# Patient Record
Sex: Male | Born: 1990 | Race: Black or African American | Hispanic: No | Marital: Single | Smoking: Never smoker
Health system: Southern US, Community
[De-identification: ages and names within clinical notes are randomized; demographics above are authoritative.]

---
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
## 2011-08-07 ENCOUNTER — Emergency Department (HOSPITAL_COMMUNITY)
Admission: EM | Admit: 2011-08-07 | Discharge: 2011-08-07 | Disposition: A | Discharge status: Home / Self Care | Payer: Self-pay | Attending: Emergency Medicine | Admitting: Emergency Medicine

## 2011-08-07 DIAGNOSIS — M719 Bursopathy, unspecified: Secondary | ICD-10-CM | POA: Insufficient documentation

## 2011-08-07 DIAGNOSIS — M25519 Pain in unspecified shoulder: Secondary | ICD-10-CM | POA: Insufficient documentation

## 2011-08-07 DIAGNOSIS — M67919 Unspecified disorder of synovium and tendon, unspecified shoulder: Secondary | ICD-10-CM | POA: Insufficient documentation

## 2012-04-23 ENCOUNTER — Emergency Department (HOSPITAL_COMMUNITY): Payer: BC Managed Care – PPO

## 2012-04-23 ENCOUNTER — Emergency Department (HOSPITAL_COMMUNITY)
Admission: EM | Admit: 2012-04-23 | Discharge: 2012-04-23 | Disposition: A | Discharge status: Home / Self Care | Payer: BC Managed Care – PPO | Attending: Emergency Medicine | Admitting: Emergency Medicine

## 2012-04-23 ENCOUNTER — Encounter (HOSPITAL_COMMUNITY): Payer: Self-pay | Admitting: *Deleted

## 2012-04-23 DIAGNOSIS — K2990 Gastroduodenitis, unspecified, without bleeding: Secondary | ICD-10-CM | POA: Insufficient documentation

## 2012-04-23 DIAGNOSIS — M545 Low back pain, unspecified: Secondary | ICD-10-CM | POA: Insufficient documentation

## 2012-04-23 DIAGNOSIS — M549 Dorsalgia, unspecified: Secondary | ICD-10-CM

## 2012-04-23 DIAGNOSIS — K2970 Gastritis, unspecified, without bleeding: Secondary | ICD-10-CM

## 2012-04-23 LAB — URINALYSIS, ROUTINE W REFLEX MICROSCOPIC
Glucose, UA: NEGATIVE mg/dL
Hgb urine dipstick: NEGATIVE
Ketones, ur: 15 mg/dL — AB
Leukocytes, UA: NEGATIVE
Nitrite: NEGATIVE
Protein, ur: NEGATIVE mg/dL
Specific Gravity, Urine: 1.036 — ABNORMAL HIGH (ref 1.005–1.030)
Urobilinogen, UA: 1 mg/dL (ref 0.0–1.0)
pH: 6 (ref 5.0–8.0)

## 2012-04-23 LAB — CBC WITH DIFFERENTIAL/PLATELET
Basophils Absolute: 0 10*3/uL (ref 0.0–0.1)
Basophils Relative: 0 % (ref 0–1)
Eosinophils Absolute: 0 10*3/uL (ref 0.0–0.7)
Eosinophils Relative: 0 % (ref 0–5)
HCT: 46.4 % (ref 39.0–52.0)
Hemoglobin: 15.8 g/dL (ref 13.0–17.0)
Lymphocytes Relative: 6 % — ABNORMAL LOW (ref 12–46)
Lymphs Abs: 0.5 10*3/uL — ABNORMAL LOW (ref 0.7–4.0)
MCH: 30.8 pg (ref 26.0–34.0)
MCHC: 34.1 g/dL (ref 30.0–36.0)
MCV: 90.4 fL (ref 78.0–100.0)
Monocytes Absolute: 0.4 10*3/uL (ref 0.1–1.0)
Monocytes Relative: 5 % (ref 3–12)
Neutro Abs: 7.6 10*3/uL (ref 1.7–7.7)
Neutrophils Relative %: 89 % — ABNORMAL HIGH (ref 43–77)
Platelets: 210 10*3/uL (ref 150–400)
RBC: 5.13 MIL/uL (ref 4.22–5.81)
RDW: 12.1 % (ref 11.5–15.5)
WBC: 8.6 10*3/uL (ref 4.0–10.5)

## 2012-04-23 LAB — COMPREHENSIVE METABOLIC PANEL WITH GFR
ALT: 21 U/L (ref 0–53)
AST: 25 U/L (ref 0–37)
Albumin: 4.5 g/dL (ref 3.5–5.2)
Alkaline Phosphatase: 86 U/L (ref 39–117)
BUN: 12 mg/dL (ref 6–23)
CO2: 26 meq/L (ref 19–32)
Calcium: 9.6 mg/dL (ref 8.4–10.5)
Chloride: 99 meq/L (ref 96–112)
Creatinine, Ser: 0.97 mg/dL (ref 0.50–1.35)
GFR calc Af Amer: >90 mL/min
GFR calc non Af Amer: >90 mL/min
Glucose, Bld: 91 mg/dL (ref 70–99)
Potassium: 4.3 meq/L (ref 3.5–5.1)
Sodium: 134 meq/L — ABNORMAL LOW (ref 135–145)
Total Bilirubin: 1.9 mg/dL — ABNORMAL HIGH (ref 0.3–1.2)
Total Protein: 7.9 g/dL (ref 6.0–8.3)

## 2012-04-23 MED ORDER — KETOROLAC TROMETHAMINE 60 MG/2ML IM SOLN
60.0000 mg | Freq: Once | INTRAMUSCULAR | Status: AC
  Administered 2012-04-23: 60 mg via INTRAMUSCULAR
  Filled 2012-04-23: qty 2

## 2012-04-23 MED ORDER — DICLOFENAC SODIUM 75 MG PO TBEC: 75.0000 mg | DELAYED_RELEASE_TABLET | Freq: Two times a day (BID) | ORAL | Status: AC

## 2012-04-23 MED ORDER — ONDANSETRON 8 MG PO TBDP
8.0000 mg | ORAL_TABLET | Freq: Once | ORAL | Status: AC
Start: 2012-04-23 — End: 2012-04-23
  Administered 2012-04-23: 8 mg via ORAL
  Filled 2012-04-23: qty 1

## 2012-04-23 MED ORDER — PROMETHAZINE HCL 25 MG PO TABS: 25.0000 mg | ORAL_TABLET | Freq: Four times a day (QID) | ORAL | Status: AC | PRN

## 2012-04-23 MED ORDER — CYCLOBENZAPRINE HCL 10 MG PO TABS: 10.0000 mg | ORAL_TABLET | Freq: Three times a day (TID) | ORAL | Status: AC | PRN

## 2012-04-23 MED ORDER — GI COCKTAIL ~~LOC~~
30.0000 mL | Freq: Once | ORAL | Status: AC
  Administered 2012-04-23: 30 mL via ORAL
  Filled 2012-04-23: qty 30

## 2012-04-23 NOTE — ED Notes (Signed)
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 Pt denies any nausea at this time but states he has had several emesis today

## 2012-04-23 NOTE — ED Notes (Signed)
Pt states he has had back pain since he was younger. Pt states pain has increased. Pt denies any injury to his back. Pt states pain radiates to hip with nausea and emesis

## 2012-04-23 NOTE — ED Provider Notes (Signed)
History     CSN: [id number]  Arrival date & time 04/23/12  1646   First MD Initiated Contact with Patient 04/23/12 1734      6:46 PM HPI Reports chronic back pain since middle school.  Reports back is hurting again. Reports pain is sharp and radiates to bilateral hips. Denies saddle anesthesias, perineal numbness, abdominal pain, fever, or urinary symptoms. Pt does reports nausea and vomiting, which is a new symptoms for him Patient is a 21 y.o. male presenting with back pain and vomiting. The history is provided by the patient and a parent.  Back Pain  This is a new problem. The problem occurs constantly. The problem has not changed since onset.The pain is associated with no known injury. The pain is present in the lumbar spine. The quality of the pain is described as stabbing and shooting. The pain radiates to the left thigh and right thigh. The pain is severe. The symptoms are aggravated by certain positions. Pertinent negatives include no chest pain, no fever, no numbness, no headaches, no abdominal pain, no bowel incontinence, no perianal numbness, no bladder incontinence, no dysuria, no pelvic pain, no leg pain, no paresthesias, no paresis, no tingling and no weakness. He has tried muscle relaxants and NSAIDs for the symptoms. The treatment provided no relief.  Emesis  This is a new problem. The current episode started yesterday. The problem has not changed since onset.There has been no fever. Pertinent negatives include no abdominal pain, no cough, no fever, no headaches and no URI.    History reviewed. No pertinent past medical history.  History reviewed. No pertinent past surgical history.  No family history on file.  History  Substance Use Topics  . Smoking status: Never Smoker   . Smokeless tobacco: Not on file  . Alcohol Use: No      Review of Systems  Constitutional: Negative for fever.  Respiratory: Negative for cough.   Cardiovascular: Negative for chest pain.    Gastrointestinal: Positive for vomiting. Negative for abdominal pain and bowel incontinence.  Genitourinary: Negative for bladder incontinence, dysuria and pelvic pain.  Musculoskeletal: Positive for back pain.  Neurological: Negative for tingling, weakness, numbness, headaches and paresthesias.  All other systems reviewed and are negative.    Allergies  Review of patient's allergies indicates no known allergies.  Home Medications  No current outpatient prescriptions on file.  BP 114/68  Pulse 96  Temp 99.2 F (37.3 C)  Resp 22  Ht 5\' 9"  (1.753 m)  Wt 165 lb (74.844 kg)  BMI 24.37 kg/m2  SpO2 98%  Physical Exam  Vitals reviewed. Constitutional: He is oriented to person, place, and time. He appears well-developed and well-nourished.  HENT:  Head: Normocephalic and atraumatic.  Eyes: Conjunctivae are normal. Pupils are equal, round, and reactive to light.  Neck: Normal range of motion. Neck supple.  Cardiovascular: Normal rate, regular rhythm and normal heart sounds.   Pulmonary/Chest: Effort normal and breath sounds normal.  Abdominal: Soft. Bowel sounds are normal. He exhibits no distension and no mass. There is no tenderness. There is no rebound and no guarding.  Musculoskeletal:       Lumbar back: He exhibits tenderness. He exhibits normal range of motion, no swelling, no edema, no laceration, no pain, no spasm and normal pulse.       Back:  Neurological: He is alert and oriented to person, place, and time.  Skin: Skin is warm and dry. No rash noted. No erythema. No pallor.  Psychiatric: He has a normal mood and affect. His behavior is normal.    ED Course  Procedures   Results for orders placed during the hospital encounter of 04/23/12  URINALYSIS, ROUTINE W REFLEX MICROSCOPIC      Component Value Range   Color, Urine AMBER (*) YELLOW   APPearance CLEAR  CLEAR   Specific Gravity, Urine 1.036 (*) 1.005 - 1.030   pH 6.0  5.0 - 8.0   Glucose, UA NEGATIVE   NEGATIVE mg/dL   Hgb urine dipstick NEGATIVE  NEGATIVE   Bilirubin Urine SMALL (*) NEGATIVE   Ketones, ur 15 (*) NEGATIVE mg/dL   Protein, ur NEGATIVE  NEGATIVE mg/dL   Urobilinogen, UA 1.0  0.0 - 1.0 mg/dL   Nitrite NEGATIVE  NEGATIVE   Leukocytes, UA NEGATIVE  NEGATIVE  CBC WITH DIFFERENTIAL      Component Value Range   WBC 8.6  4.0 - 10.5 K/uL   RBC 5.13  4.22 - 5.81 MIL/uL   Hemoglobin 15.8  13.0 - 17.0 g/dL   HCT 47.8  29.5 - 62.1 %   MCV 90.4  78.0 - 100.0 fL   MCH 30.8  26.0 - 34.0 pg   MCHC 34.1  30.0 - 36.0 g/dL   RDW 30.8  65.7 - 84.6 %   Platelets 210  150 - 400 K/uL   Neutrophils Relative 89 (*) 43 - 77 %   Neutro Abs 7.6  1.7 - 7.7 K/uL   Lymphocytes Relative 6 (*) 12 - 46 %   Lymphs Abs 0.5 (*) 0.7 - 4.0 K/uL   Monocytes Relative 5  3 - 12 %   Monocytes Absolute 0.4  0.1 - 1.0 K/uL   Eosinophils Relative 0  0 - 5 %   Eosinophils Absolute 0.0  0.0 - 0.7 K/uL   Basophils Relative 0  0 - 1 %   Basophils Absolute 0.0  0.0 - 0.1 K/uL  COMPREHENSIVE METABOLIC PANEL      Component Value Range   Sodium 134 (*) 135 - 145 mEq/L   Potassium 4.3  3.5 - 5.1 mEq/L   Chloride 99  96 - 112 mEq/L   CO2 26  19 - 32 mEq/L   Glucose, Bld 91  70 - 99 mg/dL   BUN 12  6 - 23 mg/dL   Creatinine, Ser 9.62  0.50 - 1.35 mg/dL   Calcium 9.6  8.4 - 95.2 mg/dL   Total Protein 7.9  6.0 - 8.3 g/dL   Albumin 4.5  3.5 - 5.2 g/dL   AST 25  0 - 37 U/L   ALT 21  0 - 53 U/L   Alkaline Phosphatase 86  39 - 117 U/L   Total Bilirubin 1.9 (*) 0.3 - 1.2 mg/dL   GFR calc non Af Amer >90  >90 mL/min   GFR calc Af Amer >90  >90 mL/min   Dg Lumbar Spine Complete  04/23/2012  *RADIOLOGY REPORT*  Clinical Data: Low back pain.  No injury.  LUMBAR SPINE - COMPLETE 4+ VIEW  Comparison: None  Findings: There are five lumbar-type vertebral bodies.  No fracture or malalignment.  Disc spaces well maintained.  SI joints are symmetric.  IMPRESSION: Normal study.  Original Report Authenticated By: Cyndie Chime,  M.D.     MDM   Reports improved symptoms. Ready for d/c. Likely has chronic back pain and gastritis. Will d/c with antiemetics, analgesics and muscle relaxants. Patient voices understanding. Referral to Dr. Luiz Blare  Thomasene Lot, PA-C 04/23/12 2012

## 2012-05-01 NOTE — ED Provider Notes (Signed)
Medical screening examination/treatment/procedure(s) were performed by non-physician practitioner and as supervising physician I was immediately available for consultation/collaboration.  Celes Dedic, MD 05/01/12 0847 

## 2013-04-04 ENCOUNTER — Ambulatory Visit: Payer: BC Managed Care – PPO | Admitting: Family Medicine

## 2013-04-04 DIAGNOSIS — Z0289 Encounter for other administrative examinations: Secondary | ICD-10-CM

## 2013-06-21 IMAGING — CR DG LUMBAR SPINE COMPLETE 4+V
5 series · 5 of 5 positions shown · non-contrast
Comparison: None

CLINICAL DATA: Low back pain.  No injury.

LUMBAR SPINE - COMPLETE 4+ VIEW

[t lumbar spine ap]
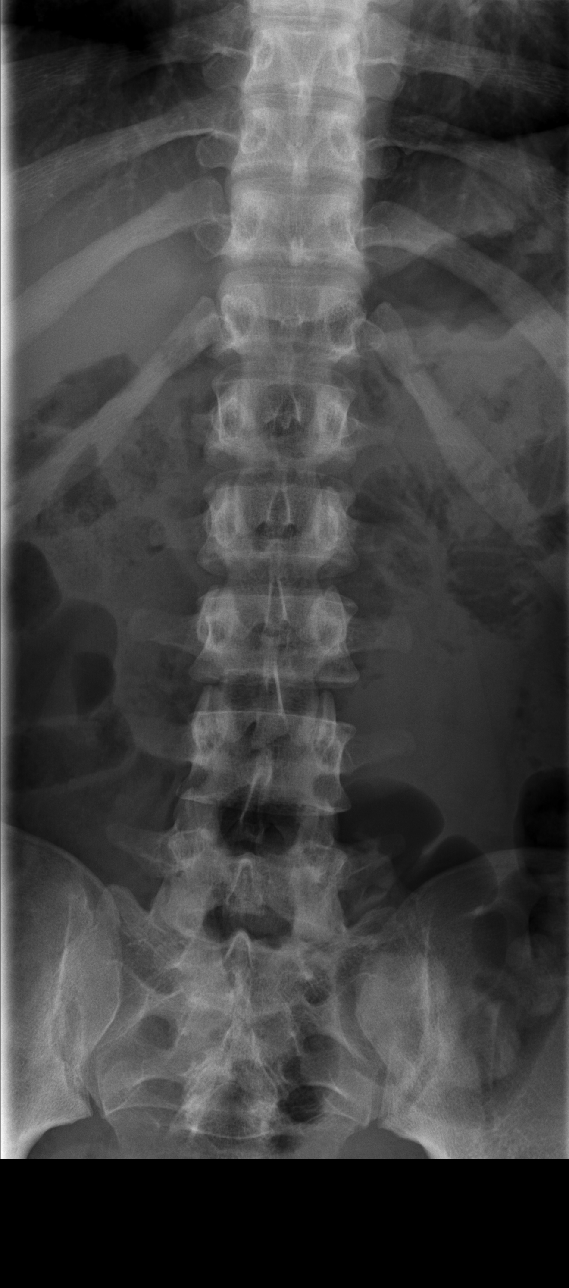

[t lumbar spine obl (1 of 2)]
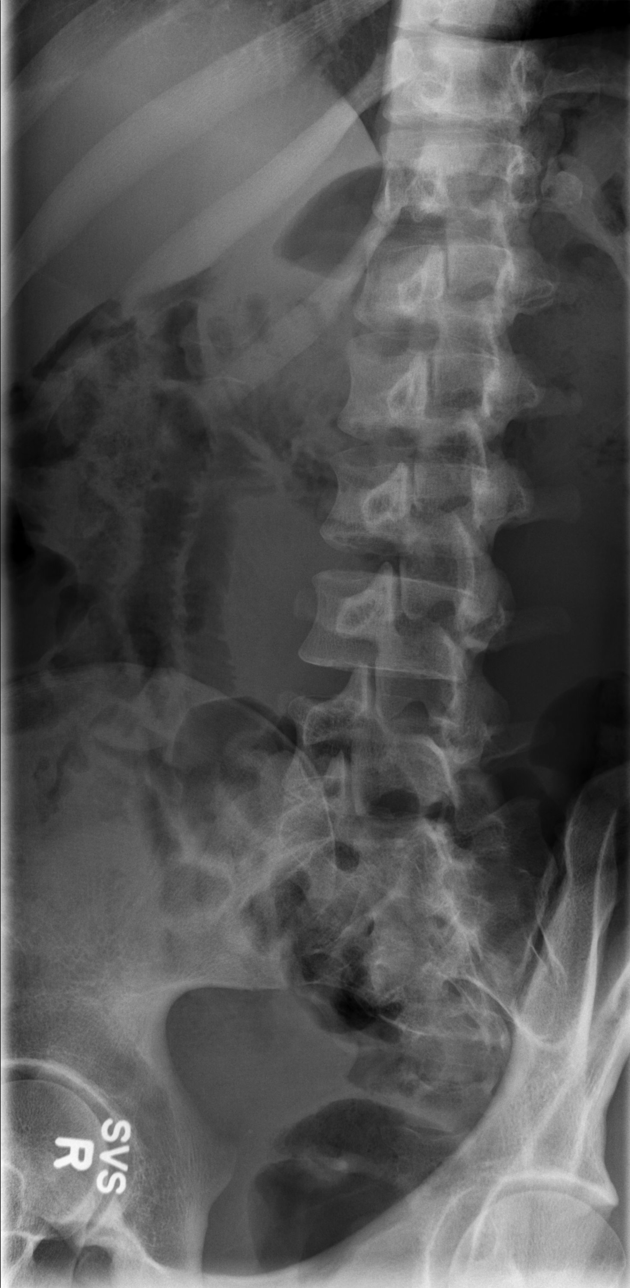

[t lumbar spine obl (2 of 2)]
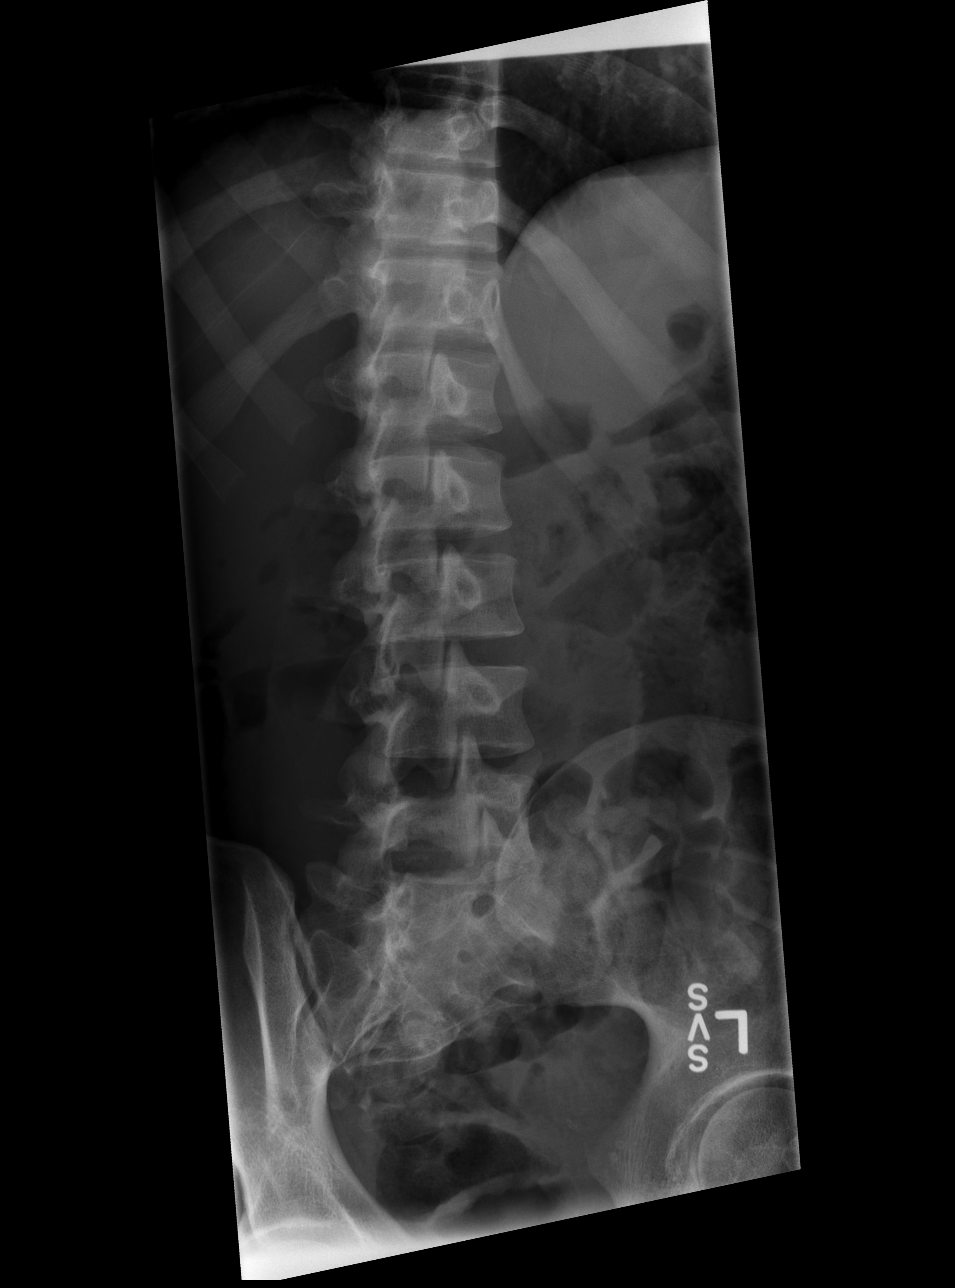

[t lumbar spine lat]
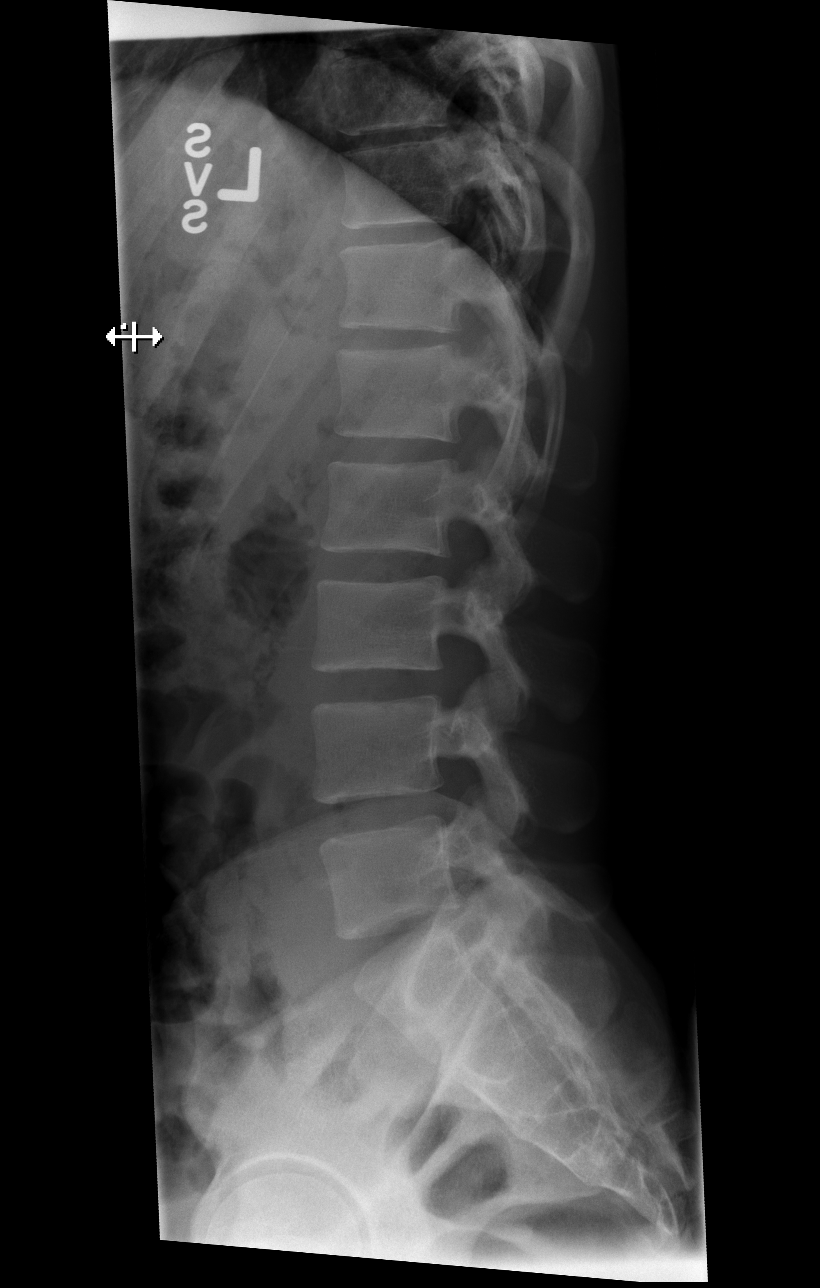

[t lumbar l-5 s-1 spot]
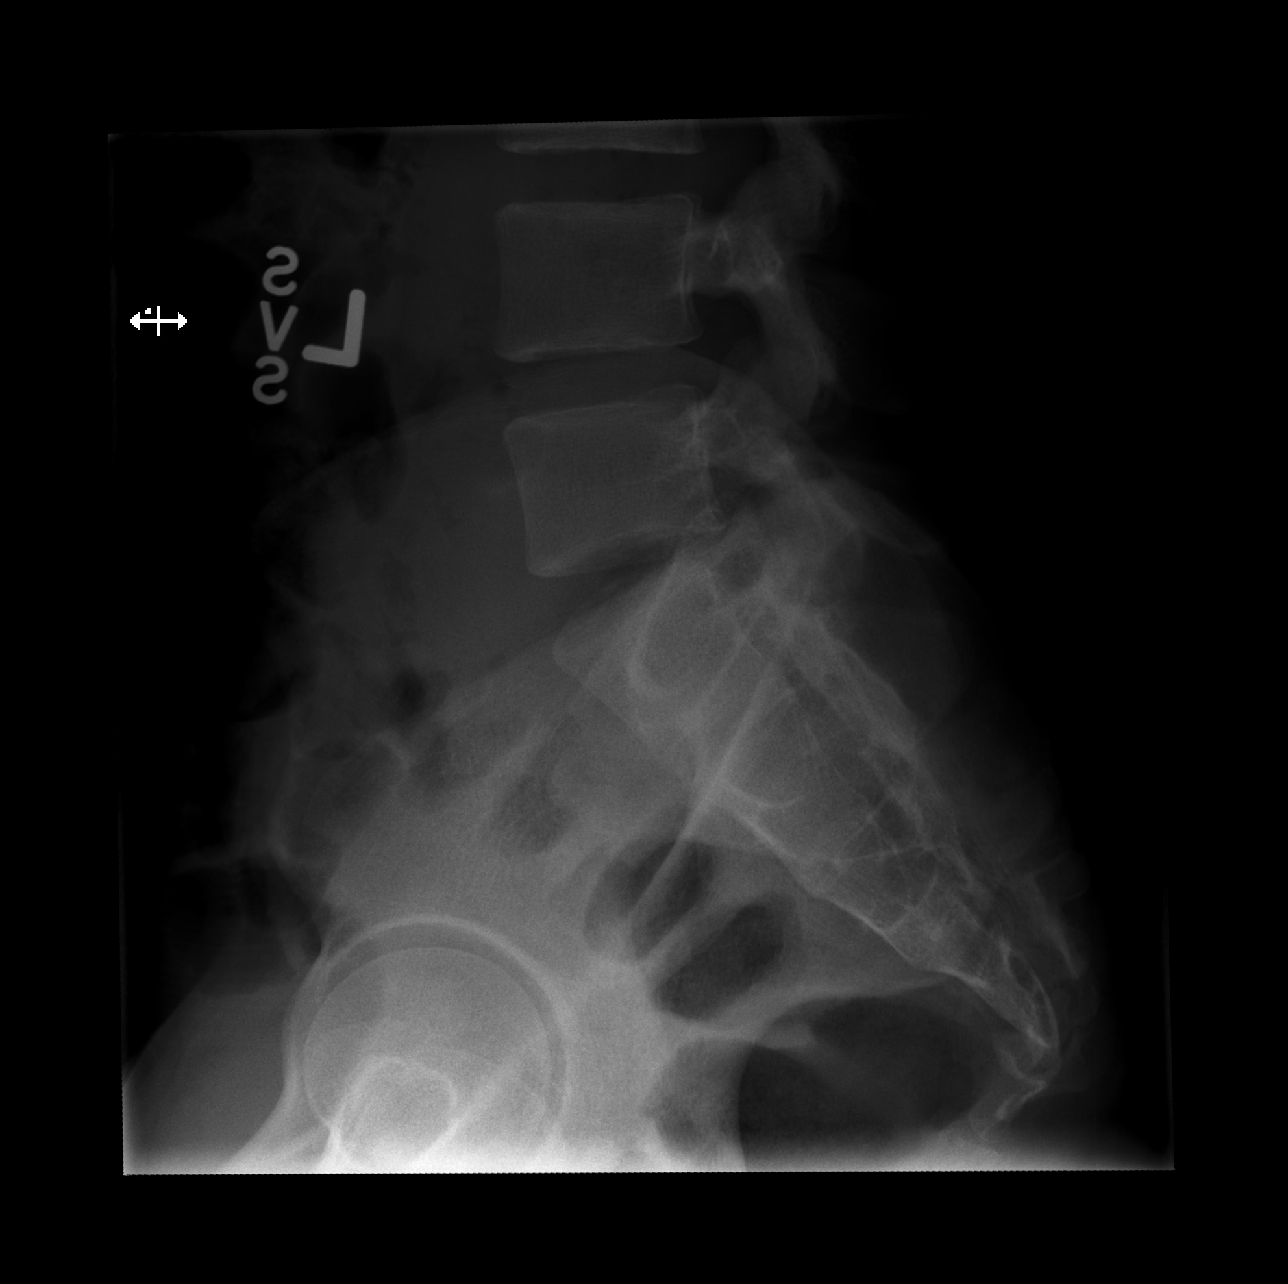

[5 of 5 positions shown; findings below may reference images not displayed]

FINDINGS: There are five lumbar-type vertebral bodies.  No fracture
or malalignment.  Disc spaces well maintained.  SI joints are
symmetric.
IMPRESSION: Normal study.

## 2015-09-06 ENCOUNTER — Encounter (HOSPITAL_COMMUNITY): Payer: Self-pay | Admitting: Emergency Medicine

## 2015-09-06 ENCOUNTER — Emergency Department (HOSPITAL_COMMUNITY)
Admission: EM | Admit: 2015-09-06 | Discharge: 2015-09-06 | Disposition: A | Discharge status: Home / Self Care | Payer: BLUE CROSS/BLUE SHIELD | Attending: Emergency Medicine | Admitting: Emergency Medicine

## 2015-09-06 DIAGNOSIS — M545 Low back pain, unspecified: Secondary | ICD-10-CM

## 2015-09-06 DIAGNOSIS — G8929 Other chronic pain: Secondary | ICD-10-CM | POA: Diagnosis not present

## 2015-09-06 MED ORDER — CYCLOBENZAPRINE HCL 10 MG PO TABS: 10.0000 mg | ORAL_TABLET | Freq: Two times a day (BID) | ORAL | Status: DC | PRN

## 2015-09-06 MED ORDER — IBUPROFEN 600 MG PO TABS: 600.0000 mg | ORAL_TABLET | Freq: Four times a day (QID) | ORAL | Status: AC | PRN

## 2015-09-06 NOTE — ED Provider Notes (Signed)
CSN: [id number]     Arrival date & time 09/06/15  1141 History  By signing my name below, I, Placido SouLogan Joldersma, attest that this documentation has been prepared under the direction and in the presence of Newell RubbermaidJeffrey Jesiel Garate, PA-C. Electronically Signed: Placido SouLogan Joldersma, ED Scribe. 09/06/2015. 12:58 PM.   Chief Complaint  Patient presents with  . Back Pain   The history is provided by the patient. No language interpreter was used.    HPI Comments: Darrell Pearson is a 24 y.o. male who presents to the Emergency Department complaining of intermittent, moderate, non-radiating, lower back pain with onset a few years ago and worsening symptoms beginning 1.5 weeks ago. He describes the pain as sharp noting worsening pain with movement, ambulation or palpation. He notes applying lidocaine patches to the affected region which provided mild relief. Pt does note having recently switched to a new mattress but denies any recent heavy lifting. Pt denies taking any regular medications. He denies IVDA, fevers, chills, numbness, weakness and incontinence of his bowels or bladder.   History reviewed. No pertinent past medical history. History reviewed. No pertinent past surgical history. No family history on file. Social History  Substance Use Topics  . Smoking status: Never Smoker   . Smokeless tobacco: None  . Alcohol Use: No    Review of Systems A complete 10 system review of systems was obtained and all systems are negative except as noted in the HPI and PMH.  Allergies  Tylenol  Home Medications   Prior to Admission medications   Medication Sig Start Date End Date Taking? Authorizing Provider  cyclobenzaprine (FLEXERIL) 10 MG tablet Take 1 tablet (10 mg total) by mouth 2 (two) times daily as needed for muscle spasms. 09/06/15   Eyvonne MechanicJeffrey Nikeshia Keetch, PA-C  ibuprofen (ADVIL,MOTRIN) 600 MG tablet Take 1 tablet (600 mg total) by mouth every 6 (six) hours as needed. 09/06/15   Eyvonne MechanicJeffrey Gawain Crombie, PA-C   promethazine (PHENERGAN) 25 MG tablet Take 1 tablet (25 mg total) by mouth every 6 (six) hours as needed for nausea. 04/23/12 04/30/12  Brigitte Cyndie ChimeNguyen, PA-C   BP 139/76 mmHg  Pulse 77  Temp(Src) 98.1 F (36.7 C) (Oral)  Resp 16  SpO2 99% Physical Exam  Constitutional: He is oriented to person, place, and time. He appears well-developed and well-nourished. No distress.  HENT:  Head: Normocephalic.  Neck: Normal range of motion. Neck supple.  Pulmonary/Chest: Effort normal.  Musculoskeletal: Normal range of motion. He exhibits tenderness. He exhibits no edema.  No C, T, or L spine tenderness to palpation. No obvious signs of trauma, deformity, infection, step-offs. Lung expansion normal. No scoliosis or kyphosis. Bilateral lower extremity strength 5 out of 5, sensation grossly intact, patellar reflexes 2+, Refill less than 3 seconds.  Straight leg negative Ambulates without difficulty  Neurological: He is alert and oriented to person, place, and time. He has normal strength and normal reflexes. He displays normal reflexes. No cranial nerve deficit or sensory deficit. Coordination normal.  Reflex Scores:      Patellar reflexes are 2+ on the right side and 2+ on the left side. Skin: Skin is warm and dry. He is not diaphoretic.  Psychiatric: He has a normal mood and affect. His behavior is normal. Judgment and thought content normal.  Nursing note and vitals reviewed.  ED Course  Procedures  DIAGNOSTIC STUDIES: Oxygen Saturation is 99% on RA, normal by my interpretation.    COORDINATION OF CARE: 12:43 PM Pt presents today due to lower  back pain. Discussed treatment plan with pt at bedside including at home recommendations for rehabilitation and an rx for flexeril and ibuprofen. Return precautions noted. Pt agreed to plan.  Labs Review Labs Reviewed - No data to display  Imaging Review No results found.   EKG Interpretation None      MDM   Final diagnoses:  Bilateral low back  pain without sciatica   Labs: none indicated  Imaging: none indicated  Consults: none  Therapeutics: flexeril and ibuprofen  Assessment:  Plan: Patient presents with acute on chronic low back pain. He has no red flags on exam today, likely musculoskeletal. He'll be given the above medications, encouraged to follow up with his primary care if symptoms do not improve after one week, or worsen. Patient verbalized understanding and agreement to today's plan  I personally performed the services described in this documentation, which was scribed in my presence. The recorded information has been reviewed and is accurate.    Eyvonne Mechanic, PA-C 09/06/15 2022  Cathren Laine, MD 09/07/15 [phone]

## 2015-09-06 NOTE — ED Notes (Addendum)
Patient states the middle of his lower back has had pain that "has been going on for a while now." Patient states his lower back pain has gotten worse this week. He states, "I have to stay in the bed an extra 30 minutes in the morning to get the strength to get up" Ambulated to triage room, alert, oriented x4, and in no acute distress.

## 2015-09-06 NOTE — ED Notes (Signed)
PA at bedside.

## 2015-09-06 NOTE — Discharge Instructions (Signed)

## 2018-05-18 ENCOUNTER — Encounter (HOSPITAL_COMMUNITY): Payer: Self-pay | Admitting: Emergency Medicine

## 2018-05-18 ENCOUNTER — Emergency Department (HOSPITAL_COMMUNITY)
Admission: EM | Admit: 2018-05-18 | Discharge: 2018-05-19 | Disposition: A | Discharge status: Home / Self Care | Payer: BLUE CROSS/BLUE SHIELD | Attending: Emergency Medicine | Admitting: Emergency Medicine

## 2018-05-18 DIAGNOSIS — L089 Local infection of the skin and subcutaneous tissue, unspecified: Secondary | ICD-10-CM

## 2018-05-18 NOTE — ED Triage Notes (Signed)
Pt has an abscess on his right hand, was told it was an "ingrown hair" but the swelling continued to "get worser".  States he can not move or use his hand.

## 2018-05-19 MED ORDER — DOXYCYCLINE HYCLATE 100 MG PO CAPS: 100.0000 mg | ORAL_CAPSULE | Freq: Two times a day (BID) | ORAL | 0 refills | Status: AC

## 2018-05-19 MED ORDER — DOXYCYCLINE HYCLATE 100 MG PO TABS
100.0000 mg | ORAL_TABLET | Freq: Once | ORAL | Status: AC
Start: 2018-05-19 — End: 2018-05-19
  Administered 2018-05-19: 100 mg via ORAL
  Filled 2018-05-19: qty 1

## 2018-05-19 NOTE — ED Notes (Signed)
One touch pt, see PA assessment.

## 2018-05-19 NOTE — ED Notes (Signed)
Family at bedside. 

## 2018-05-19 NOTE — Discharge Instructions (Signed)
Please take all of your antibiotics until finished!   You may develop abdominal discomfort or diarrhea from the antibiotic.  You may help offset this with probiotics which you can buy or get in yogurt. Do not eat  or take the probiotics until 2 hours after your antibiotic.   Alternate 600 mg of ibuprofen and [phone] mg of Tylenol every 3 hours as needed for pain. Do not exceed 4000 mg of Tylenol daily.  Take ibuprofen with food to avoid upset stomach issues.  Apply warm compresses 2-3 times daily.  Apply ice pack for comfort to help with swelling if needed.  Return to the emergency department in 2 days for reevaluation.  Return to the ED sooner if there is any worsening spread of redness, streaking, fevers, vomiting or nausea, or numbness.

## 2018-05-19 NOTE — ED Notes (Signed)
ED Provider at bedside. 

## 2018-05-19 NOTE — ED Provider Notes (Signed)
[facility] EMERGENCY DEPARTMENT Provider Note   CSN: [id number] Arrival date & time: 05/18/18  2221     History   Chief Complaint Chief Complaint  Patient presents with  . Hand Pain    HPI Darrell Pearson is a 27 y.o. male presents for evaluation of acute onset, progressively worsening right fifth digit pain and swelling for 3 days.  He noted mild swelling and erythema to the right fifth proximal phalanx region which has worsened.  He notes constant throbbing pain which radiates down the hand.  Pain worsens with palpation and movement of the digit.  He does note some numbness and tingling.  He denies fevers, chills, nausea, vomiting, or recent injuries.  He denies drainage from the area.  He has been applying ice with mild relief.  He is visiting from [place] and states that he had this evaluated in an ED in [place] and was told that he had "folliculitis ".  The history is provided by the patient.    History reviewed. No pertinent past medical history.  Patient Active Problem List   Diagnosis Date Noted  . KNEE PAIN, LEFT 03/31/2008  . BACK PAIN 03/31/2008  . ADHD 07/03/2007    History reviewed. No pertinent surgical history.      Home Medications    Prior to Admission medications   Medication Sig Start Date End Date Taking? Authorizing Provider  cyclobenzaprine (FLEXERIL) 10 MG tablet Take 1 tablet (10 mg total) by mouth 2 (two) times daily as needed for muscle spasms. 09/06/15   Hedges, Tinnie Gens, PA-C  doxycycline (VIBRAMYCIN) 100 MG capsule Take 1 capsule (100 mg total) by mouth 2 (two) times daily for 7 days. 05/19/18 05/26/18  Michela Pitcher A, PA-C  ibuprofen (ADVIL,MOTRIN) 600 MG tablet Take 1 tablet (600 mg total) by mouth every 6 (six) hours as needed. 09/06/15   Hedges, Tinnie Gens, PA-C  promethazine (PHENERGAN) 25 MG tablet Take 1 tablet (25 mg total) by mouth every 6 (six) hours as needed for nausea. 04/23/12 04/30/12  Thomasene Lot, PA-C     Family History No family history on file.  Social History Social History   Tobacco Use  . Smoking status: Never Smoker  Substance Use Topics  . Alcohol use: No  . Drug use: Yes    Types: Marijuana     Allergies   Tylenol [acetaminophen]   Review of Systems Review of Systems  Constitutional: Negative for chills, diaphoresis and fever.  Gastrointestinal: Negative for nausea and vomiting.  Musculoskeletal: Positive for arthralgias.  Skin: Positive for color change.  Neurological: Positive for numbness. Negative for syncope and weakness.     Physical Exam Updated Vital Signs BP (!) 149/89   Pulse 71   Temp 98.3 F (36.8 C) (Oral)   Resp 16   Ht 5\' 9"  (1.753 m)   Wt 81.6 kg (180 lb)   SpO2 98%   BMI 26.58 kg/m   Physical Exam  Constitutional: He appears well-developed and well-nourished. No distress.  HENT:  Head: Normocephalic and atraumatic.  Eyes: Conjunctivae are normal. Right eye exhibits no discharge. Left eye exhibits no discharge.  Neck: No JVD present. No tracheal deviation present.  Cardiovascular: Normal rate and intact distal pulses.  2+ radial pulses bilaterally  Pulmonary/Chest: Effort normal.  Abdominal: He exhibits no distension.  Musculoskeletal: He exhibits no edema.  See below images.  Patient has moderate swelling and erythema to the dorsum of the right fifth proximal phalanx.  There is no palmar  erythema.  There is tenderness to palpation overlying this area, the fifth MCP joint and the dorsum of the right hand surrounding this region.  No underlying crepitus or deformity noted.  No abnormal drainage.  No pain with passive extension no fluctuance noted.  No pain to percussion of the flexor tendon sheath..  5/5 strength of BUE major muscle groups including good grip strength bilaterally and 5/5 strength of wrist and digits with flexion extension against resistance.  No snuffbox tenderness.  Neurological: He is alert.  Fluent speech with no  evidence of dysarthria or aphasia, no facial droop, sensation intact to soft touch of bilateral upper extremities bilateral hands  Skin: Skin is warm and dry. There is erythema.  Psychiatric: He has a normal mood and affect. His behavior is normal.  Nursing note and vitals reviewed.        ED Treatments / Results  Labs (all labs ordered are listed, but only abnormal results are displayed) Labs Reviewed - No data to display  EKG None  Radiology No results found.  Procedures Procedures (including critical care time) EMERGENCY DEPARTMENT US SOFT TISSUE INTERPRETATION "Study: Limited Soft Tissue Ultrasound"  INDICATIONS: Pain and Soft tissue infection Multiple views of the body part were obtained in real-time with a multi-frequency linear probe  PERFORMED BY: Myself IMAGES ARCHIVED?: Yes SIDE:Right  BODY PART:finger INTERPRETATION:  No abcess noted and Cellulitis present    Medications Ordered in ED Medications  doxycycline (VIBRA-TABS) tablet 100 mg (has no administration in time range)     Initial Impression / Assessment and Plan / ED Course  I have reviewed the triage vital signs and the nursing notes.  Pertinent labs & imaging results that were available during my care of the patient were reviewed by me and considered in my medical decision making (see chart for details).     Presentation consistent with cellulitis of the right fifth proximal phalanx region.  Patient is afebrile, vital signs are stable.  He is nontoxic in appearance.  He is neurovascularly intact.  He does not have fusiform swelling, the digit is not held in passive flexion, and he has no tenderness to percussion of the flexor tendon sheath of the fifth digit.  There is no pain with passive extension.  I have a low suspicion of flexor tenosynovitis as a result.  Doubt underlying fracture in the absence of trauma.  Doubt necrotizing fasciitis.  Bedside ultrasound performed by myself with  cobblestoning noted suggestive of cellulitis but no definitive fluid collection.  Will treat with doxycycline.  Recommend return to the ED in 2 days for reevaluation and reassessment.  Discussed strict ED return precautions. Pt and patient's sister verbalized understanding of and agreement with plan and patient is safe for discharge home at this time.   Final Clinical Impressions(s) / ED Diagnoses   Final diagnoses:  Finger infection    ED Discharge Orders        Ordered    doxycycline (VIBRAMYCIN) 100 MG capsule  2 times daily     05/19/18 0024       Michela PitcherFawze, Nicolina Hirt A, PA-C 05/19/18 0042    Derwood KaplanNanavati, Ankit, MD 05/19/18 [phone]

## 2022-04-15 ENCOUNTER — Other Ambulatory Visit: Payer: Self-pay

## 2022-04-15 ENCOUNTER — Emergency Department (HOSPITAL_COMMUNITY): Payer: Self-pay

## 2022-04-15 ENCOUNTER — Emergency Department (HOSPITAL_COMMUNITY)
Admission: EM | Admit: 2022-04-15 | Discharge: 2022-04-15 | Disposition: A | Discharge status: Home / Self Care | Payer: Self-pay | Attending: Emergency Medicine | Admitting: Emergency Medicine

## 2022-04-15 DIAGNOSIS — Y9367 Activity, basketball: Secondary | ICD-10-CM | POA: Insufficient documentation

## 2022-04-15 DIAGNOSIS — X501XXA Overexertion from prolonged static or awkward postures, initial encounter: Secondary | ICD-10-CM | POA: Insufficient documentation

## 2022-04-15 DIAGNOSIS — S93402A Sprain of unspecified ligament of left ankle, initial encounter: Secondary | ICD-10-CM | POA: Insufficient documentation

## 2023-04-11 ENCOUNTER — Emergency Department (HOSPITAL_BASED_OUTPATIENT_CLINIC_OR_DEPARTMENT_OTHER)
Admission: EM | Admit: 2023-04-11 | Discharge: 2023-04-11 | Disposition: A | Discharge status: Home / Self Care | Payer: Managed Care, Other (non HMO) | Attending: Emergency Medicine | Admitting: Emergency Medicine

## 2023-04-11 ENCOUNTER — Other Ambulatory Visit: Payer: Self-pay

## 2023-04-11 DIAGNOSIS — X58XXXA Exposure to other specified factors, initial encounter: Secondary | ICD-10-CM | POA: Insufficient documentation

## 2023-04-11 DIAGNOSIS — A749 Chlamydial infection, unspecified: Secondary | ICD-10-CM | POA: Insufficient documentation

## 2023-04-11 DIAGNOSIS — S39012A Strain of muscle, fascia and tendon of lower back, initial encounter: Secondary | ICD-10-CM | POA: Insufficient documentation

## 2023-04-11 DIAGNOSIS — M545 Low back pain, unspecified: Secondary | ICD-10-CM | POA: Diagnosis present

## 2023-04-11 DIAGNOSIS — Z202 Contact with and (suspected) exposure to infections with a predominantly sexual mode of transmission: Secondary | ICD-10-CM

## 2023-04-11 MED ORDER — CYCLOBENZAPRINE HCL 10 MG PO TABS: 10.0000 mg | ORAL_TABLET | Freq: Two times a day (BID) | ORAL | 0 refills | Status: AC | PRN

## 2023-04-11 MED ORDER — DOXYCYCLINE HYCLATE 100 MG PO CAPS: 100.0000 mg | ORAL_CAPSULE | Freq: Two times a day (BID) | ORAL | 0 refills | Status: AC

## 2023-04-11 MED ORDER — CYCLOBENZAPRINE HCL 10 MG PO TABS
10.0000 mg | ORAL_TABLET | Freq: Once | ORAL | Status: AC
  Administered 2023-04-11: 10 mg via ORAL
  Filled 2023-04-11: qty 1

## 2023-04-11 MED ORDER — IBUPROFEN 400 MG PO TABS
600.0000 mg | ORAL_TABLET | Freq: Once | ORAL | Status: AC
  Administered 2023-04-11: 600 mg via ORAL
  Filled 2023-04-11: qty 1

## 2023-04-11 MED ORDER — LIDOCAINE 5 % EX PTCH: 1.0000 | MEDICATED_PATCH | CUTANEOUS | 0 refills | Status: AC

## 2023-04-11 MED ORDER — LIDOCAINE 5 % EX PTCH
1.0000 | MEDICATED_PATCH | Freq: Once | CUTANEOUS | Status: DC
  Administered 2023-04-11: 1 via TRANSDERMAL
  Filled 2023-04-11: qty 1

## 2023-04-11 MED ORDER — DOXYCYCLINE HYCLATE 100 MG PO TABS
100.0000 mg | ORAL_TABLET | Freq: Once | ORAL | Status: AC
  Administered 2023-04-11: 100 mg via ORAL
  Filled 2023-04-11: qty 1

## 2023-04-11 NOTE — Discharge Instructions (Addendum)
You were seen in the emergency department for your back pain and exposure to chlamydia.  You likely strained your back [place] muscle spasm.  You can take Tylenol and Motrin as needed for pain and both can be taken up to every 6 hours.  You can use lidocaine patches, ice or heat.  I given you some Flexeril which is a muscle relaxer to take as needed though this can make you drowsy so do not take before driving or working or operating heavy machinery.  You were tested for both gonorrhea and chlamydia and were given the treatment for chlamydia.  You should complete this as prescribed and abstain from sex for at least 1 week after completing the antibiotics to prevent reinfection as well as recommend that your partners to be tested and treated as well.  You can follow-up with your primary doctor to have your symptoms rechecked.  You should return to the emergency department if you do have testicular pain or swelling, numbness or weakness in your legs and you are unable to walk, you are unable to urinate or if you have any other new or concerning symptoms.

## 2023-04-11 NOTE — ED Notes (Signed)
Discharge instructions, follow up care, and prescriptions reviewed and explained, pt verbalized understanding and had no further questions on d/c. Pt caox4, ambulatory, NAD on d/c.  

## 2023-04-11 NOTE — ED Provider Notes (Signed)
[place] EMERGENCY DEPARTMENT AT [facility] Provider Note   CSN: [id number] Arrival date & time: 04/11/23  6578     History  Chief Complaint  Patient presents with   Back Pain    Darrell Pearson is a 32 y.o. male.  Patient is a 32 year old male with no significant past medical history presenting to the emergency department with back pain.  Patient states for the last 2 to 3 days he has had left lower back pain that has been constant.  He states that he does do repetitive bending for work but denies any heavy lifting, trauma or falls.  He denies any associated numbness or weakness.  He denies any saddle anesthesia, he denies any loss of bowel or bladder function.  He denies any associated fever or urinary symptoms.  He states that he takes some Tylenol for symptoms at home without significant relief.  He is also requesting STI testing and treatment.  He states that he did have a known chlamydia exposure.  He is requesting treatment for chlamydia only at this time.  He denies any associated symptoms.  The history is provided by the patient and the spouse.  Back Pain      Home Medications Prior to Admission medications   Medication Sig Start Date End Date Taking? Authorizing Provider  cyclobenzaprine (FLEXERIL) 10 MG tablet Take 1 tablet (10 mg total) by mouth 2 (two) times daily as needed for muscle spasms. 04/11/23  Yes Elayne Snare K, DO  doxycycline (VIBRAMYCIN) 100 MG capsule Take 1 capsule (100 mg total) by mouth 2 (two) times daily for 7 days. 04/11/23 04/18/23 Yes Kingsley, Benetta Spar K, DO  lidocaine (LIDODERM) 5 % Place 1 patch onto the skin daily. Remove & Discard patch within 12 hours or as directed by MD 04/11/23  Yes Theresia Lo, Cecile Sheerer, DO  ibuprofen (ADVIL,MOTRIN) 600 MG tablet Take 1 tablet (600 mg total) by mouth every 6 (six) hours as needed. 09/06/15   Hedges, Tinnie Gens, PA-C  promethazine (PHENERGAN) 25 MG tablet Take 1 tablet (25 mg total) by mouth  every 6 (six) hours as needed for nausea. 04/23/12 04/30/12  Chilton Si, PA-C      Allergies    Tylenol [acetaminophen]    Review of Systems   Review of Systems  Musculoskeletal:  Positive for back pain.    Physical Exam Updated Vital Signs BP 116/86 (BP Location: Right Arm)   Pulse 70   Temp 97.6 F (36.4 C) (Oral)   Resp 16   Ht 5\' 9"  (1.753 m)   Wt 97.5 kg   SpO2 98%   BMI 31.75 kg/m  Physical Exam Vitals and nursing note reviewed.  Constitutional:      General: He is not in acute distress.    Appearance: Normal appearance.  HENT:     Head: Normocephalic and atraumatic.     Nose: Nose normal.     Mouth/Throat:     Mouth: Mucous membranes are moist.     Pharynx: Oropharynx is clear.  Eyes:     Extraocular Movements: Extraocular movements intact.     Conjunctiva/sclera: Conjunctivae normal.  Neck:     Comments: No midline neck tenderness Cardiovascular:     Rate and Rhythm: Normal rate and regular rhythm.     Heart sounds: Normal heart sounds.  Pulmonary:     Effort: Pulmonary effort is normal.     Breath sounds: Normal breath sounds.  Abdominal:     General: Abdomen is flat.  Musculoskeletal:        General: Normal range of motion.     Cervical back: Normal range of motion and neck supple.     Comments: No midline back tenderness Left-sided lumbar paraspinal muscle tenderness to palpation without overlying skin changes No CVA tenderness bilaterally Negative straight leg raise bilaterally  Skin:    General: Skin is warm and dry.  Neurological:     General: No focal deficit present.     Mental Status: He is alert and oriented to person, place, and time.     Sensory: No sensory deficit.     Motor: No weakness (5 out of 5 strength in all muscle groups in bilateral lower extremities).  Psychiatric:        Mood and Affect: Mood normal.        Behavior: Behavior normal.     ED Results / Procedures / Treatments   Labs (all labs ordered are listed,  but only abnormal results are displayed) Labs Reviewed  GC/CHLAMYDIA PROBE AMP ([place]) NOT AT [facility]    EKG None  Radiology No results found.  Procedures Procedures    Medications Ordered in ED Medications  lidocaine (LIDODERM) 5 % 1-3 patch (1 patch Transdermal Patch Applied 04/11/23 0951)  ibuprofen (ADVIL) tablet 600 mg (600 mg Oral Given 04/11/23 0950)  cyclobenzaprine (FLEXERIL) tablet 10 mg (10 mg Oral Given 04/11/23 0950)  doxycycline (VIBRA-TABS) tablet 100 mg (100 mg Oral Given 04/11/23 0950)    ED Course/ Medical Decision Making/ A&P Clinical Course as of 04/11/23 1039  Wed Apr 11, 2023  1036 Upon reassessment, the patient reports that his pain has improved and feels comfortable with discharge home.  He was recommended primary care follow-up. [VK]    Clinical Course User Index [VK] Rexford Maus, DO                             Medical Decision Making This patient presents to the ED with chief complaint(s) of back pain, STI exposure with no pertinent past medical history which further complicates the presenting complaint. The complaint involves an extensive differential diagnosis and also carries with it a high risk of complications and morbidity.    The differential diagnosis includes muscle strain or spasm, no urinary symptoms or CVA tenderness making pyelonephritis or nephrolithiasis unlikely, negative straight leg raise making sciatica unlikely, no trauma or falls or midline back tenderness making fracture dislocation unlikely, no focal neurologic deficits, saddle anesthesia or loss of bowel or bladder function making cauda equina unlikely no fever or neurologic deficits making epidural abscess unlikely  Additional history obtained: Additional history obtained from significant other Records reviewed N/A  ED Course and Reassessment: On patient's arrival to the emergency department he is hemodynamically stable in no acute distress.  He does have lumbar  paraspinal muscle tenderness without neurologic deficits.  He will be treated for muscle strain or spasm with Motrin, lidocaine patch and Flexeril.  He also requested treatment of chlamydia only and will be treated with Doxy and tested for STIs.  He will be closely reassessed.  Independent labs interpretation:  N/A  Independent visualization of imaging: -N/A  Consultation: - Consulted or discussed management/test interpretation w/ external professional: N/A  Consideration for admission or further workup: Patient has no emergent conditions requiring admission or further work-up at this time and is stable for discharge home with primary care follow-up  Social Determinants of health: N/A  Risk Prescription drug management.          Final Clinical Impression(s) / ED Diagnoses Final diagnoses:  Strain of lumbar region, initial encounter  Possible exposure to STI    Rx / DC Orders ED Discharge Orders          Ordered    doxycycline (VIBRAMYCIN) 100 MG capsule  2 times daily        04/11/23 1037    cyclobenzaprine (FLEXERIL) 10 MG tablet  2 times daily PRN        04/11/23 1037    lidocaine (LIDODERM) 5 %  Every 24 hours        04/11/23 1037              [place], Amboy K, DO 04/11/23 1039

## 2023-04-11 NOTE — ED Triage Notes (Signed)
Pt caox4, ambulatory, NAD c/o L lower back pain radiating down L leg x2 days, worsened this morning, denies recent injury/fall. Pt also requests STD check.

## 2023-04-12 LAB — GC/CHLAMYDIA PROBE AMP (~~LOC~~) NOT AT ARMC
Chlamydia: POSITIVE — AB
Comment: NEGATIVE
Comment: NORMAL
Neisseria Gonorrhea: NEGATIVE
